# Patient Record
Sex: Female | Born: 1970 | Race: White | Hispanic: No | Marital: Married | Smoking: Never smoker
Health system: Southern US, Community
[De-identification: ages and names within clinical notes are randomized; demographics above are authoritative.]

## PROBLEM LIST (undated history)

## (undated) DIAGNOSIS — E063 Autoimmune thyroiditis: Secondary | ICD-10-CM

## (undated) DIAGNOSIS — K649 Unspecified hemorrhoids: Secondary | ICD-10-CM

## (undated) DIAGNOSIS — G43909 Migraine, unspecified, not intractable, without status migrainosus: Secondary | ICD-10-CM

## (undated) DIAGNOSIS — E039 Hypothyroidism, unspecified: Secondary | ICD-10-CM

## (undated) HISTORY — PX: ABDOMINAL HYSTERECTOMY: SHX81

## (undated) HISTORY — DX: Unspecified hemorrhoids: K64.9

## (undated) HISTORY — DX: Migraine, unspecified, not intractable, without status migrainosus: G43.909

## (undated) HISTORY — PX: ABLATION: SHX5711

## (undated) HISTORY — PX: APPENDECTOMY: SHX54

## (undated) HISTORY — PX: NOSE SURGERY: SHX723

---

## 2011-05-15 ENCOUNTER — Other Ambulatory Visit: Payer: Self-pay | Admitting: *Deleted

## 2011-05-15 DIAGNOSIS — R102 Pelvic and perineal pain: Secondary | ICD-10-CM

## 2011-05-16 ENCOUNTER — Ambulatory Visit
Admission: RE | Admit: 2011-05-16 | Discharge: 2011-05-16 | Disposition: A | Discharge status: Home / Self Care | Payer: BC Managed Care – PPO | Source: Ambulatory Visit | Attending: *Deleted | Admitting: *Deleted

## 2011-05-16 DIAGNOSIS — R102 Pelvic and perineal pain: Secondary | ICD-10-CM

## 2011-05-24 ENCOUNTER — Other Ambulatory Visit: Payer: Self-pay | Admitting: Obstetrics and Gynecology

## 2011-05-24 DIAGNOSIS — R102 Pelvic and perineal pain: Secondary | ICD-10-CM

## 2012-04-03 ENCOUNTER — Other Ambulatory Visit: Payer: Self-pay | Admitting: Obstetrics and Gynecology

## 2012-04-03 DIAGNOSIS — R928 Other abnormal and inconclusive findings on diagnostic imaging of breast: Secondary | ICD-10-CM

## 2012-04-08 ENCOUNTER — Ambulatory Visit
Admission: RE | Admit: 2012-04-08 | Discharge: 2012-04-08 | Disposition: A | Discharge status: Home / Self Care | Payer: 59 | Source: Ambulatory Visit | Attending: Obstetrics and Gynecology | Admitting: Obstetrics and Gynecology

## 2012-04-08 DIAGNOSIS — R928 Other abnormal and inconclusive findings on diagnostic imaging of breast: Secondary | ICD-10-CM

## 2012-10-27 ENCOUNTER — Other Ambulatory Visit: Payer: Self-pay | Admitting: Family Medicine

## 2012-10-27 DIAGNOSIS — R921 Mammographic calcification found on diagnostic imaging of breast: Secondary | ICD-10-CM

## 2013-01-22 ENCOUNTER — Ambulatory Visit
Admission: RE | Admit: 2013-01-22 | Discharge: 2013-01-22 | Disposition: A | Discharge status: Home / Self Care | Payer: 59 | Source: Ambulatory Visit | Attending: Family Medicine | Admitting: Family Medicine

## 2013-01-22 DIAGNOSIS — R921 Mammographic calcification found on diagnostic imaging of breast: Secondary | ICD-10-CM

## 2013-03-08 ENCOUNTER — Other Ambulatory Visit: Payer: Self-pay | Admitting: Obstetrics and Gynecology

## 2013-03-08 DIAGNOSIS — R921 Mammographic calcification found on diagnostic imaging of breast: Secondary | ICD-10-CM

## 2013-04-20 ENCOUNTER — Ambulatory Visit
Admission: RE | Admit: 2013-04-20 | Discharge: 2013-04-20 | Disposition: A | Discharge status: Home / Self Care | Payer: 59 | Source: Ambulatory Visit | Attending: Obstetrics and Gynecology | Admitting: Obstetrics and Gynecology

## 2013-04-20 DIAGNOSIS — R921 Mammographic calcification found on diagnostic imaging of breast: Secondary | ICD-10-CM

## 2014-05-27 IMAGING — MG MM DIGITAL DIAGNOSTIC BILAT
8 series · 8 of 8 positions shown · non-contrast
Comparison: 04/08/2012

CLINICAL DATA: The patient returns for 6-month follow-up of
calcifications in both breasts.

DIGITAL DIAGNOSTIC BILATERAL MAMMOGRAM WITH CAD

[R CC (1 of 2)]
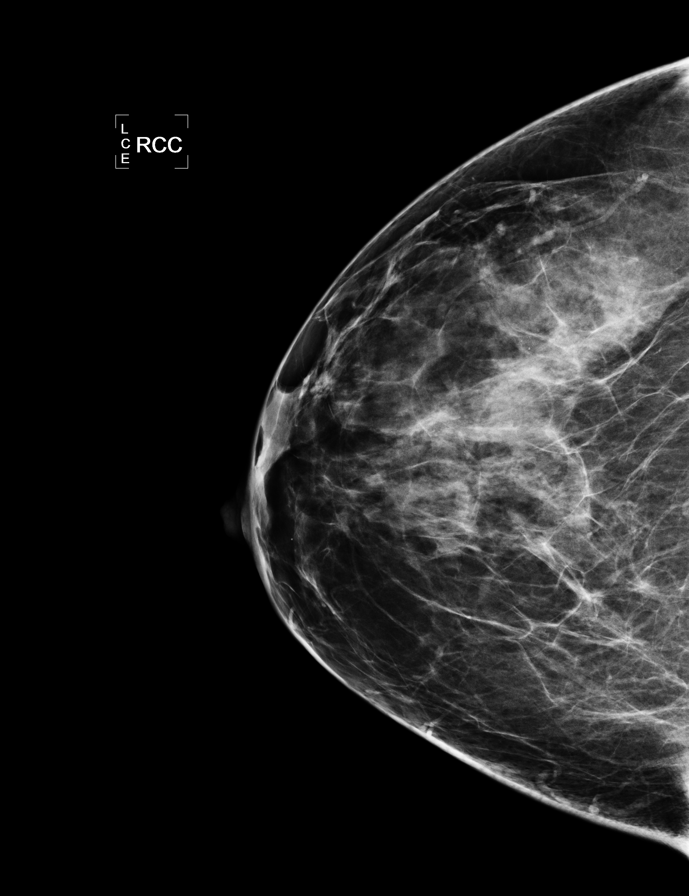

[L CC (1 of 2)]
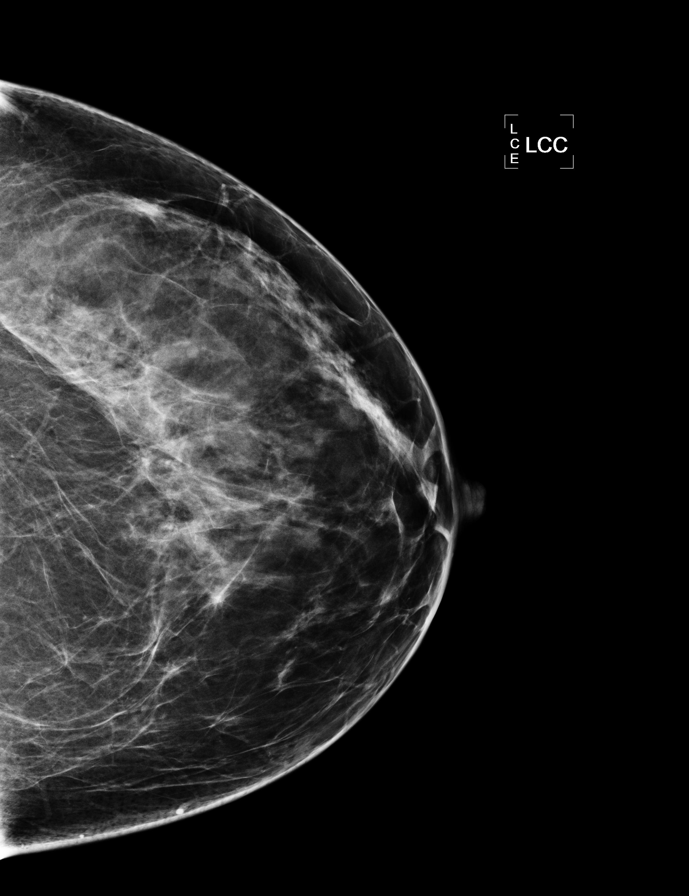

[L MLO]
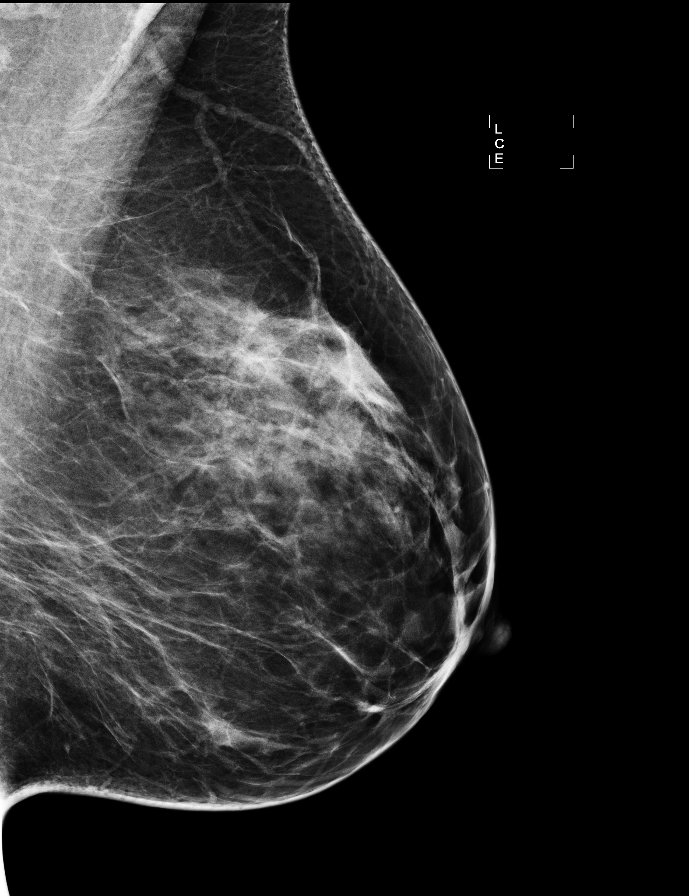

[R MLO]
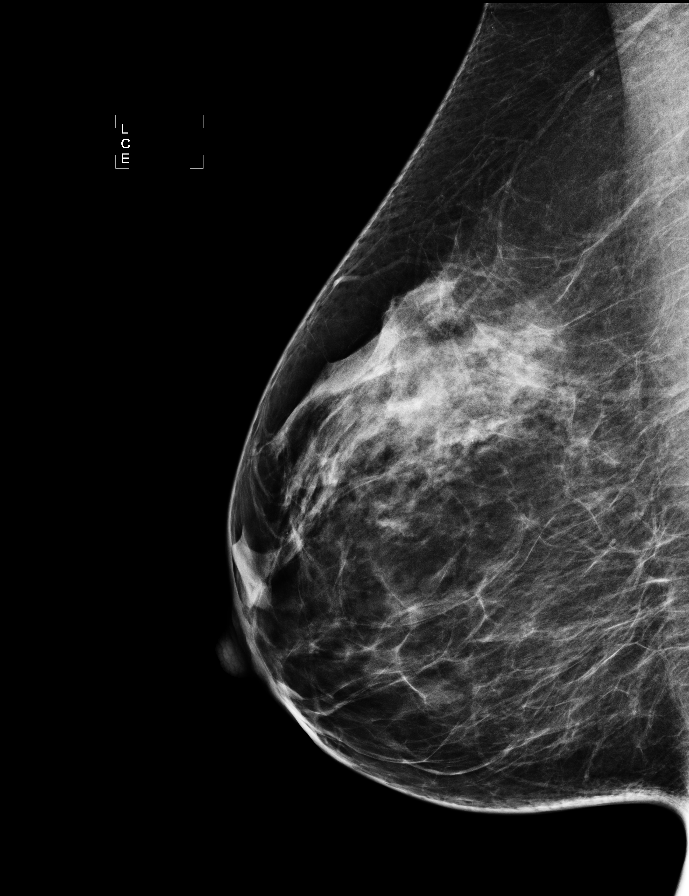

[L CC (2 of 2)]
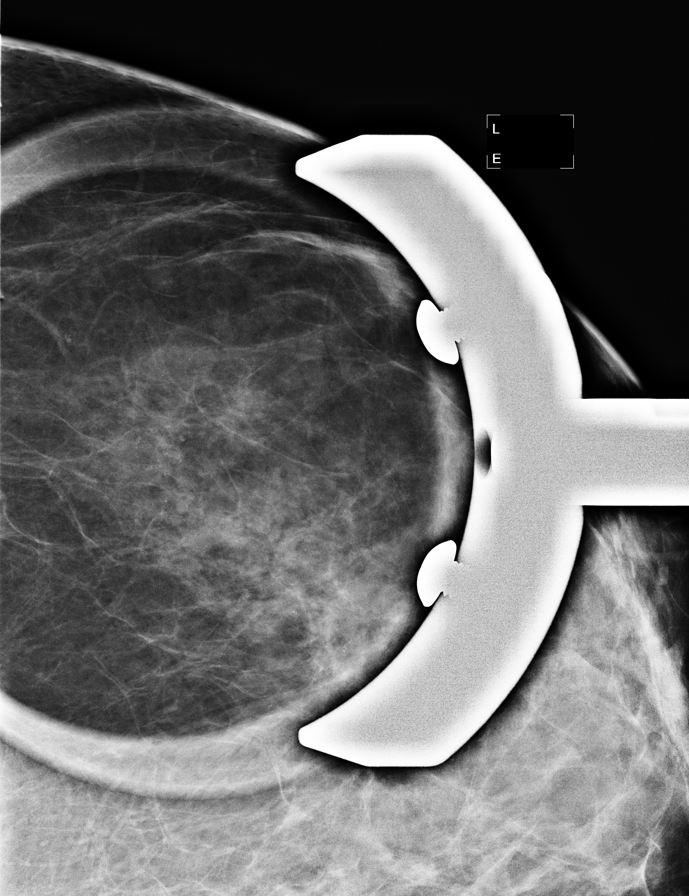

[L ML]
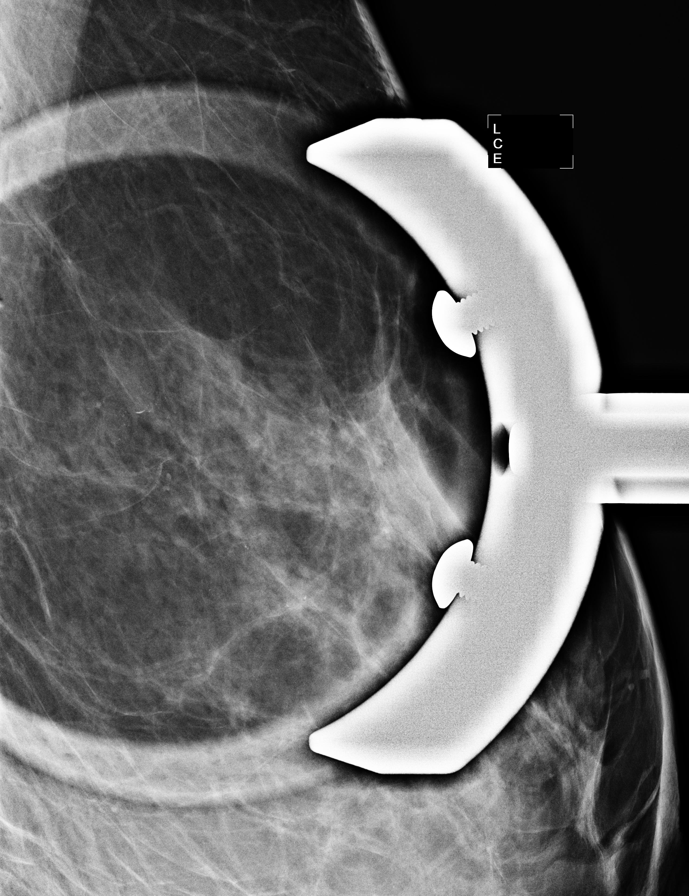

[R CC (2 of 2)]
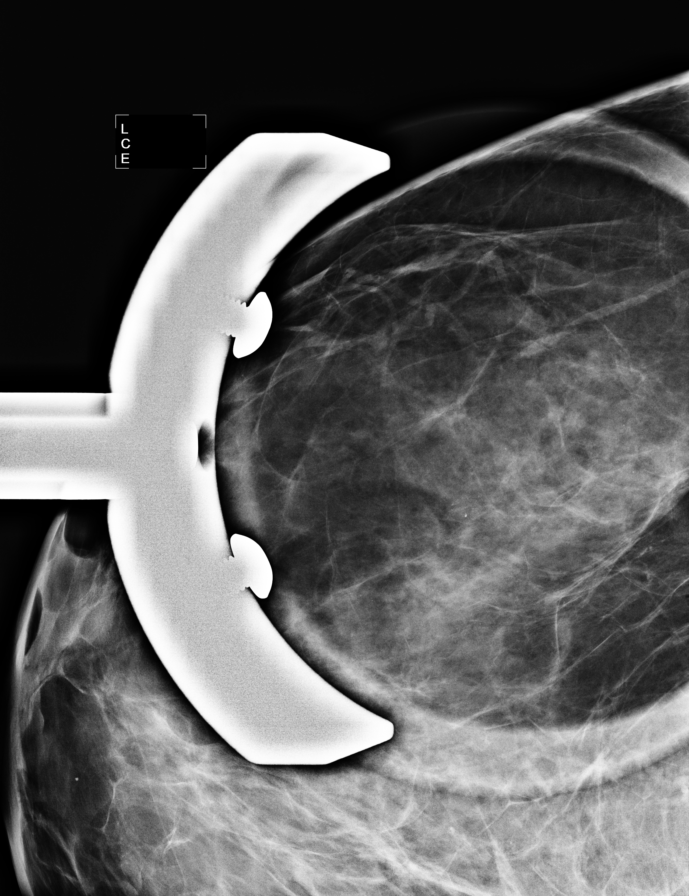

[R ML]
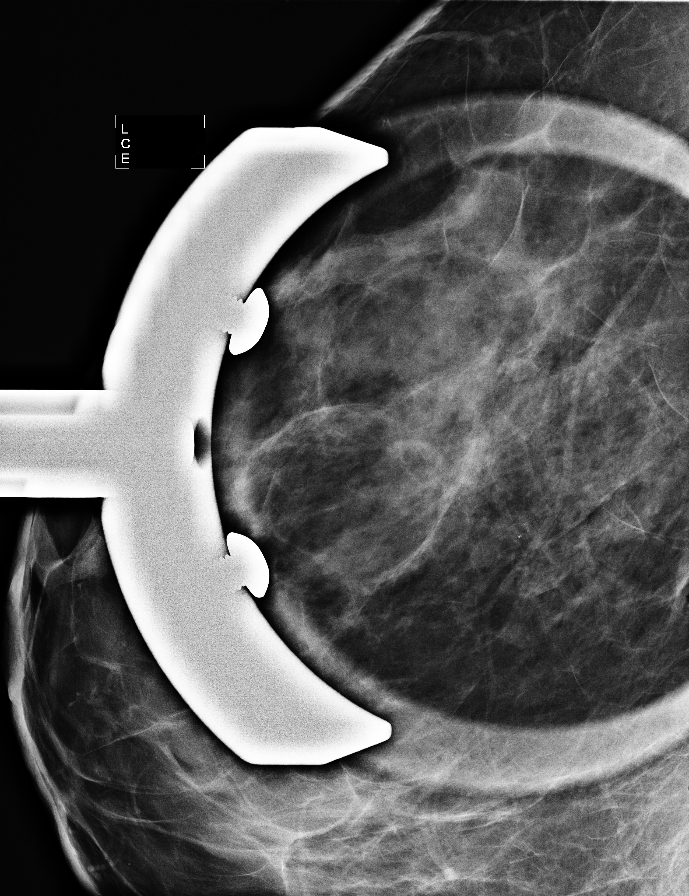

[8 of 8 positions shown; findings below may reference images not displayed]

FINDINGS: ACR Breast Density Category b: There is a scattered fibroglandular
pattern.

Magnified views are performed of calcifications in the lateral
aspects of each breast.  Calcifications appear to layer on the true
lateral view, consistent with benign, milk of calcium.  No
suspicious microcalcifications or distribution of calcifications
identified.

Mammographic images were processed with CAD.
IMPRESSION: No mammographic evidence for malignancy.

RECOMMENDATION:
Bilateral diagnostic mammogram with magnified views is suggested in
March 2013.

I have discussed the findings and recommendations with the patient.
Results were also provided in writing at the conclusion of the
visit.  If applicable, a reminder letter will be sent to the
patient regarding her next appointment.

BI-RADS CATEGORY 3:  Probably benign finding(s) - short interval
follow-up suggested.

## 2019-06-09 ENCOUNTER — Other Ambulatory Visit: Payer: Self-pay | Admitting: Obstetrics and Gynecology

## 2019-06-09 DIAGNOSIS — R928 Other abnormal and inconclusive findings on diagnostic imaging of breast: Secondary | ICD-10-CM

## 2019-06-10 ENCOUNTER — Ambulatory Visit
Admission: RE | Admit: 2019-06-10 | Discharge: 2019-06-10 | Disposition: A | Discharge status: Home / Self Care | Payer: 59 | Source: Ambulatory Visit | Attending: Obstetrics and Gynecology | Admitting: Obstetrics and Gynecology

## 2019-06-10 ENCOUNTER — Other Ambulatory Visit: Payer: Self-pay

## 2019-06-10 ENCOUNTER — Other Ambulatory Visit: Payer: Self-pay | Admitting: Obstetrics and Gynecology

## 2019-06-10 DIAGNOSIS — R928 Other abnormal and inconclusive findings on diagnostic imaging of breast: Secondary | ICD-10-CM

## 2019-06-10 DIAGNOSIS — N6320 Unspecified lump in the left breast, unspecified quadrant: Secondary | ICD-10-CM

## 2019-06-11 ENCOUNTER — Ambulatory Visit
Admission: RE | Admit: 2019-06-11 | Discharge: 2019-06-11 | Disposition: A | Discharge status: Home / Self Care | Payer: 59 | Source: Ambulatory Visit | Attending: Obstetrics and Gynecology | Admitting: Obstetrics and Gynecology

## 2019-06-11 DIAGNOSIS — N6320 Unspecified lump in the left breast, unspecified quadrant: Secondary | ICD-10-CM

## 2021-09-15 LAB — COLOGUARD: COLOGUARD: NEGATIVE

## 2023-01-01 ENCOUNTER — Other Ambulatory Visit: Payer: Self-pay | Admitting: Obstetrics and Gynecology

## 2023-01-01 DIAGNOSIS — R928 Other abnormal and inconclusive findings on diagnostic imaging of breast: Secondary | ICD-10-CM

## 2023-01-15 ENCOUNTER — Ambulatory Visit
Admission: RE | Admit: 2023-01-15 | Discharge: 2023-01-15 | Disposition: A | Discharge status: Home / Self Care | Payer: 59 | Source: Ambulatory Visit | Attending: Obstetrics and Gynecology | Admitting: Obstetrics and Gynecology

## 2023-01-15 ENCOUNTER — Other Ambulatory Visit: Payer: Self-pay | Admitting: Obstetrics and Gynecology

## 2023-01-15 DIAGNOSIS — R928 Other abnormal and inconclusive findings on diagnostic imaging of breast: Secondary | ICD-10-CM

## 2023-01-20 ENCOUNTER — Ambulatory Visit
Admission: RE | Admit: 2023-01-20 | Discharge: 2023-01-20 | Disposition: A | Discharge status: Home / Self Care | Payer: 59 | Source: Ambulatory Visit | Attending: Obstetrics and Gynecology | Admitting: Obstetrics and Gynecology

## 2023-01-20 DIAGNOSIS — R928 Other abnormal and inconclusive findings on diagnostic imaging of breast: Secondary | ICD-10-CM

## 2023-01-20 HISTORY — PX: BREAST BIOPSY: SHX20

## 2023-01-28 ENCOUNTER — Other Ambulatory Visit: Payer: Self-pay | Admitting: Surgery

## 2023-01-28 DIAGNOSIS — N6091 Unspecified benign mammary dysplasia of right breast: Secondary | ICD-10-CM

## 2023-01-29 ENCOUNTER — Other Ambulatory Visit: Payer: Self-pay | Admitting: Surgery

## 2023-01-29 DIAGNOSIS — N6091 Unspecified benign mammary dysplasia of right breast: Secondary | ICD-10-CM

## 2023-02-19 ENCOUNTER — Encounter (HOSPITAL_BASED_OUTPATIENT_CLINIC_OR_DEPARTMENT_OTHER): Payer: Self-pay | Admitting: Surgery

## 2023-02-26 ENCOUNTER — Ambulatory Visit
Admission: RE | Admit: 2023-02-26 | Discharge: 2023-02-26 | Disposition: A | Discharge status: Home / Self Care | Payer: 59 | Source: Ambulatory Visit | Attending: Surgery | Admitting: Surgery

## 2023-02-26 DIAGNOSIS — N6091 Unspecified benign mammary dysplasia of right breast: Secondary | ICD-10-CM

## 2023-02-26 HISTORY — PX: BREAST BIOPSY: SHX20

## 2023-02-26 NOTE — H&P (Signed)
REFERRING PHYSICIAN: Jeani Hawking, MD PROVIDER: Wayne Both, MD MRN: [id number] DOB: 1971/04/03  Subjective   Chief Complaint: New Consultation and Breast Problem  History of Present Illness: Jessica Dickson is a 52 y.o. female who is seen as an office consultation for evaluation of New Consultation and Breast Problem  This is a pleasant 52 year old female who was recently found on screening mammography of calcifications in the upper outer quadrant of the right breast. This measured approximately 4.5 cm. She had 2 separate biopsies of the area with 1 showing fibrocystic changes and the other showing atypical ductal hyperplasia. She has had a previous benign biopsy on the left breast years ago. She denies nipple discharge. She has a family history of breast cancer in 2 paternal aunts and her paternal grandmother. She is otherwise healthy without complaints  Review of Systems: A complete review of systems was obtained from the patient. I have reviewed this information and discussed as appropriate with the patient. See HPI as well for other ROS.  ROS   Medical History: Past Medical History:  Diagnosis Date  Thyroid disease   There is no problem list on file for this patient.  Past Surgical History:  Procedure Laterality Date  ABLATION ARRYTHMIA FOCUS  APPENDECTOMY  HYSTERECTOMY  nose surgery    Allergies  Allergen Reactions  Codeine Rash   Current Outpatient Medications on File Prior to Visit  Medication Sig Dispense Refill  cetirizine (ZYRTEC) 10 MG tablet Take 10 mg by mouth once daily  ergocalciferol, vitamin D2, 1,250 mcg (50,000 unit) capsule TAKE 1 CAPSULE BY MOUTH TWICE WEEKLY  estradiol (DOTTI) patch 0.075 mg/24hr  NP THYROID 15 mg tablet Take 15 mg by mouth once daily  omega-3 acid ethyl esters (LOVAZA) 1 gram capsule Take 2 g by mouth 2 (two) times daily  progesterone (PROMETRIUM) 200 MG capsule   No current facility-administered medications on  file prior to visit.   Family History  Problem Relation Age of Onset  High blood pressure (Hypertension) Mother  Skin cancer Father  Coronary Artery Disease (Blocked arteries around heart) Father    Social History   Tobacco Use  Smoking Status Never  Smokeless Tobacco Never    Social History   Socioeconomic History  Marital status: Married  Tobacco Use  Smoking status: Never  Smokeless tobacco: Never  Substance and Sexual Activity  Alcohol use: Yes  Comment: social  Drug use: Never   Objective:   Vitals:  01/28/23 1549  BP: (!) 147/82  Pulse: 71  Temp: 36.7 C (98 F)  SpO2: 98%  Weight: 79.8 kg (176 lb)  Height: 167.6 cm (5\' 6" )   Body mass index is 28.41 kg/m.  Physical Exam   She appears well on exam  There are no palpable breast masses. There is mild ecchymosis from her biopsy of the right breast. The nipple areolar complex is normal.  There is no axillary adenopathy  Labs, Imaging and Diagnostic Testing: I have reviewed her mammograms, ultrasound, and pathology results  Assessment and Plan:   Diagnoses and all orders for this visit:  Atypical ductal hyperplasia of right breast   I discussed the diagnosis with the patient and gave her a copy of the pathology results. It is recommended that we remove the area of atypical ductal hyperplasia for complete histologic evaluation to rule out and early cancer. I discussed the reasons for this with her. I explained proceeding with a radioactive seed guided right breast lumpectomy. I would probably do a  significant lumpectomy to remove more of the calcifications surrounding this area. I explained the surgical procedure in detail. I discussed the risks which includes but is not limited to bleeding, infection, injury to surrounding structures, the need for further procedures if malignancy is found, cardiopulmonary issues with anesthesia, postoperative recovery, etc. She understands and wishes to proceed with  surgery which will be scheduled.

## 2023-02-26 NOTE — Anesthesia Preprocedure Evaluation (Addendum)
Anesthesia Evaluation  Patient identified by MRN, date of birth, ID band Patient awake    Reviewed: Allergy & Precautions, NPO status , Patient's Chart, lab work & pertinent test results  Airway Mallampati: I  TM Distance: >3 FB Neck ROM: Full    Dental no notable dental hx. (+) Implants   Pulmonary neg pulmonary ROS   Pulmonary exam normal breath sounds clear to auscultation       Cardiovascular negative cardio ROS Normal cardiovascular exam Rhythm:Regular Rate:Normal     Neuro/Psych negative neurological ROS  negative psych ROS   GI/Hepatic negative GI ROS, Neg liver ROS,,,  Endo/Other  Hypothyroidism    Renal/GU negative Renal ROS  negative genitourinary   Musculoskeletal negative musculoskeletal ROS (+)    Abdominal   Peds  Hematology   Anesthesia Other Findings   Reproductive/Obstetrics                              Anesthesia Physical Anesthesia Plan  ASA: 2  Anesthesia Plan: General   Post-op Pain Management: Precedex, Tylenol PO (pre-op)* and Toradol IV (intra-op)*   Induction: Intravenous  PONV Risk Score and Plan: 3 and Propofol infusion, TIVA, Treatment may vary due to age or medical condition and Ondansetron  Airway Management Planned: LMA  Additional Equipment: None  Intra-op Plan:   Post-operative Plan: Extubation in OR  Informed Consent: I have reviewed the patients History and Physical, chart, labs and discussed the procedure including the risks, benefits and alternatives for the proposed anesthesia with the patient or authorized representative who has indicated his/her understanding and acceptance.     Dental advisory given  Plan Discussed with:   Anesthesia Plan Comments: (LMA TIVA  R breast CA)         Anesthesia Quick Evaluation

## 2023-02-26 NOTE — Progress Notes (Signed)

## 2023-02-27 ENCOUNTER — Ambulatory Visit (HOSPITAL_BASED_OUTPATIENT_CLINIC_OR_DEPARTMENT_OTHER): Payer: Self-pay | Admitting: Anesthesiology

## 2023-02-27 ENCOUNTER — Encounter (HOSPITAL_BASED_OUTPATIENT_CLINIC_OR_DEPARTMENT_OTHER): Payer: Self-pay | Admitting: Surgery

## 2023-02-27 ENCOUNTER — Ambulatory Visit
Admission: RE | Admit: 2023-02-27 | Discharge: 2023-02-27 | Disposition: A | Discharge status: Home / Self Care | Payer: 59 | Source: Ambulatory Visit | Attending: Surgery | Admitting: Surgery

## 2023-02-27 ENCOUNTER — Ambulatory Visit (HOSPITAL_BASED_OUTPATIENT_CLINIC_OR_DEPARTMENT_OTHER)
Admission: RE | Admit: 2023-02-27 | Discharge: 2023-02-27 | Disposition: A | Discharge status: Home / Self Care | Payer: 59 | Attending: Surgery | Admitting: Surgery

## 2023-02-27 ENCOUNTER — Other Ambulatory Visit: Payer: Self-pay

## 2023-02-27 ENCOUNTER — Encounter (HOSPITAL_BASED_OUTPATIENT_CLINIC_OR_DEPARTMENT_OTHER)
Admission: RE | Disposition: A | Discharge status: Home / Self Care | Payer: Self-pay | Source: Home / Self Care | Attending: Surgery

## 2023-02-27 ENCOUNTER — Ambulatory Visit (HOSPITAL_BASED_OUTPATIENT_CLINIC_OR_DEPARTMENT_OTHER): Payer: 59 | Admitting: Anesthesiology

## 2023-02-27 DIAGNOSIS — Z79899 Other long term (current) drug therapy: Secondary | ICD-10-CM | POA: Diagnosis not present

## 2023-02-27 DIAGNOSIS — E039 Hypothyroidism, unspecified: Secondary | ICD-10-CM | POA: Insufficient documentation

## 2023-02-27 DIAGNOSIS — Z803 Family history of malignant neoplasm of breast: Secondary | ICD-10-CM | POA: Diagnosis not present

## 2023-02-27 DIAGNOSIS — N6091 Unspecified benign mammary dysplasia of right breast: Secondary | ICD-10-CM | POA: Diagnosis present

## 2023-02-27 DIAGNOSIS — Z79890 Hormone replacement therapy (postmenopausal): Secondary | ICD-10-CM | POA: Insufficient documentation

## 2023-02-27 DIAGNOSIS — E079 Disorder of thyroid, unspecified: Secondary | ICD-10-CM | POA: Insufficient documentation

## 2023-02-27 DIAGNOSIS — Z419 Encounter for procedure for purposes other than remedying health state, unspecified: Secondary | ICD-10-CM

## 2023-02-27 HISTORY — PX: BREAST LUMPECTOMY WITH RADIOACTIVE SEED LOCALIZATION: SHX6424

## 2023-02-27 HISTORY — DX: Hypothyroidism, unspecified: E03.9

## 2023-02-27 HISTORY — DX: Autoimmune thyroiditis: E06.3

## 2023-02-27 SURGERY — BREAST LUMPECTOMY WITH RADIOACTIVE SEED LOCALIZATION
Anesthesia: General | Site: Breast | Laterality: Right

## 2023-02-27 MED ORDER — ONDANSETRON HCL 4 MG/2ML IJ SOLN
INTRAMUSCULAR | Status: DC | PRN
  Administered 2023-02-27: 4 mg via INTRAVENOUS

## 2023-02-27 MED ORDER — 0.9 % SODIUM CHLORIDE (POUR BTL) OPTIME
TOPICAL | Status: DC | PRN
  Administered 2023-02-27: 1000 mL

## 2023-02-27 MED ORDER — ENSURE PRE-SURGERY PO LIQD: 296.0000 mL | Freq: Once | ORAL | Status: DC

## 2023-02-27 MED ORDER — KETOROLAC TROMETHAMINE 30 MG/ML IJ SOLN
INTRAMUSCULAR | Status: DC | PRN
  Administered 2023-02-27: 30 mg via INTRAVENOUS

## 2023-02-27 MED ORDER — LACTATED RINGERS IV SOLN: INTRAVENOUS | Status: DC

## 2023-02-27 MED ORDER — PROPOFOL 500 MG/50ML IV EMUL
INTRAVENOUS | Status: DC | PRN
  Administered 2023-02-27: 200 ug/kg/min via INTRAVENOUS

## 2023-02-27 MED ORDER — KETOROLAC TROMETHAMINE 30 MG/ML IJ SOLN: 30.0000 mg | Freq: Once | INTRAMUSCULAR | Status: DC | PRN

## 2023-02-27 MED ORDER — ONDANSETRON HCL 4 MG/2ML IJ SOLN: 4.0000 mg | Freq: Once | INTRAMUSCULAR | Status: DC | PRN

## 2023-02-27 MED ORDER — FENTANYL CITRATE (PF) 100 MCG/2ML IJ SOLN
INTRAMUSCULAR | Status: DC | PRN
  Administered 2023-02-27: 50 ug via INTRAVENOUS

## 2023-02-27 MED ORDER — FENTANYL CITRATE (PF) 100 MCG/2ML IJ SOLN
INTRAMUSCULAR | Status: AC
  Filled 2023-02-27: qty 2

## 2023-02-27 MED ORDER — ACETAMINOPHEN 500 MG PO TABS
ORAL_TABLET | ORAL | Status: AC
  Filled 2023-02-27: qty 2

## 2023-02-27 MED ORDER — DEXAMETHASONE SODIUM PHOSPHATE 10 MG/ML IJ SOLN
INTRAMUSCULAR | Status: AC
  Filled 2023-02-27: qty 1

## 2023-02-27 MED ORDER — MIDAZOLAM HCL 2 MG/2ML IJ SOLN
INTRAMUSCULAR | Status: AC
  Filled 2023-02-27: qty 2

## 2023-02-27 MED ORDER — DEXMEDETOMIDINE HCL IN NACL 80 MCG/20ML IV SOLN
INTRAVENOUS | Status: DC | PRN
  Administered 2023-02-27: 12 ug via INTRAVENOUS

## 2023-02-27 MED ORDER — PROPOFOL 500 MG/50ML IV EMUL
INTRAVENOUS | Status: AC
  Filled 2023-02-27: qty 50

## 2023-02-27 MED ORDER — HYDROMORPHONE HCL 1 MG/ML IJ SOLN: 0.2500 mg | INTRAMUSCULAR | Status: DC | PRN

## 2023-02-27 MED ORDER — OXYCODONE HCL 5 MG/5ML PO SOLN: 5.0000 mg | Freq: Once | ORAL | Status: DC | PRN

## 2023-02-27 MED ORDER — MIDAZOLAM HCL 5 MG/5ML IJ SOLN
INTRAMUSCULAR | Status: DC | PRN
  Administered 2023-02-27: 2 mg via INTRAVENOUS

## 2023-02-27 MED ORDER — TRAMADOL HCL 50 MG PO TABS: 50.0000 mg | ORAL_TABLET | Freq: Four times a day (QID) | ORAL | 0 refills | Status: DC | PRN

## 2023-02-27 MED ORDER — CHLORHEXIDINE GLUCONATE CLOTH 2 % EX PADS: 6.0000 | MEDICATED_PAD | Freq: Once | CUTANEOUS | Status: DC

## 2023-02-27 MED ORDER — AMISULPRIDE (ANTIEMETIC) 5 MG/2ML IV SOLN: 10.0000 mg | Freq: Once | INTRAVENOUS | Status: DC | PRN

## 2023-02-27 MED ORDER — ONDANSETRON HCL 4 MG/2ML IJ SOLN
INTRAMUSCULAR | Status: AC
  Filled 2023-02-27: qty 2

## 2023-02-27 MED ORDER — OXYCODONE HCL 5 MG PO TABS: 5.0000 mg | ORAL_TABLET | Freq: Once | ORAL | Status: DC | PRN

## 2023-02-27 MED ORDER — BUPIVACAINE-EPINEPHRINE 0.5% -1:200000 IJ SOLN
INTRAMUSCULAR | Status: DC | PRN
  Administered 2023-02-27: 12 mL

## 2023-02-27 MED ORDER — CEFAZOLIN SODIUM-DEXTROSE 2-4 GM/100ML-% IV SOLN
INTRAVENOUS | Status: AC
  Filled 2023-02-27: qty 100

## 2023-02-27 MED ORDER — PROPOFOL 10 MG/ML IV BOLUS
INTRAVENOUS | Status: DC | PRN
Start: 2023-02-27 — End: 2023-02-27
  Administered 2023-02-27: 150 mg via INTRAVENOUS
  Administered 2023-02-27: 50 mg via INTRAVENOUS

## 2023-02-27 MED ORDER — LIDOCAINE 2% (20 MG/ML) 5 ML SYRINGE
INTRAMUSCULAR | Status: AC
  Filled 2023-02-27: qty 5

## 2023-02-27 MED ORDER — BUPIVACAINE-EPINEPHRINE (PF) 0.5% -1:200000 IJ SOLN
INTRAMUSCULAR | Status: AC
  Filled 2023-02-27: qty 30

## 2023-02-27 MED ORDER — DEXAMETHASONE SODIUM PHOSPHATE 4 MG/ML IJ SOLN
INTRAMUSCULAR | Status: DC | PRN
  Administered 2023-02-27: 5 mg via INTRAVENOUS

## 2023-02-27 MED ORDER — ACETAMINOPHEN 500 MG PO TABS
1000.0000 mg | ORAL_TABLET | ORAL | Status: AC
  Administered 2023-02-27: 1000 mg via ORAL

## 2023-02-27 MED ORDER — CEFAZOLIN SODIUM-DEXTROSE 2-4 GM/100ML-% IV SOLN
2.0000 g | INTRAVENOUS | Status: AC
  Administered 2023-02-27: 2 g via INTRAVENOUS

## 2023-02-27 SURGICAL SUPPLY — 48 items
ADH SKN CLS APL DERMABOND .7 (GAUZE/BANDAGES/DRESSINGS) ×1
APL PRP STRL LF DISP 70% ISPRP (MISCELLANEOUS) ×1
APPLIER CLIP 9.375 MED OPEN (MISCELLANEOUS)
APR CLP MED 9.3 20 MLT OPN (MISCELLANEOUS)
BINDER BREAST 3XL (GAUZE/BANDAGES/DRESSINGS) IMPLANT
BINDER BREAST LRG (GAUZE/BANDAGES/DRESSINGS) IMPLANT
BINDER BREAST MEDIUM (GAUZE/BANDAGES/DRESSINGS) IMPLANT
BINDER BREAST XLRG (GAUZE/BANDAGES/DRESSINGS) IMPLANT
BINDER BREAST XXLRG (GAUZE/BANDAGES/DRESSINGS) IMPLANT
BLADE SURG 15 STRL LF DISP TIS (BLADE) ×2 IMPLANT
BLADE SURG 15 STRL SS (BLADE) ×1
CANISTER SUC SOCK COL 7IN (MISCELLANEOUS) IMPLANT
CANISTER SUCT 1200ML W/VALVE (MISCELLANEOUS) IMPLANT
CHLORAPREP W/TINT 26 (MISCELLANEOUS) ×1 IMPLANT
CLIP APPLIE 9.375 MED OPEN (MISCELLANEOUS) IMPLANT
COVER BACK TABLE 60X90IN (DRAPES) ×1 IMPLANT
COVER MAYO STAND STRL (DRAPES) ×2 IMPLANT
COVER PROBE CYLINDRICAL 5X96 (MISCELLANEOUS) ×2 IMPLANT
DERMABOND ADVANCED .7 DNX12 (GAUZE/BANDAGES/DRESSINGS) ×2 IMPLANT
DRAPE LAPAROSCOPIC ABDOMINAL (DRAPES) ×2 IMPLANT
DRAPE UTILITY XL STRL (DRAPES) ×2 IMPLANT
ELECT REM PT RETURN 9FT ADLT (ELECTROSURGICAL) ×1
ELECTRODE REM PT RTRN 9FT ADLT (ELECTROSURGICAL) ×1 IMPLANT
GAUZE SPONGE 4X4 12PLY STRL (GAUZE/BANDAGES/DRESSINGS) IMPLANT
GAUZE SPONGE 4X4 12PLY STRL LF (GAUZE/BANDAGES/DRESSINGS) IMPLANT
GLOVE SURG SIGNA 7.5 PF LTX (GLOVE) ×2 IMPLANT
GOWN STRL REUS W/ TWL LRG LVL3 (GOWN DISPOSABLE) ×2 IMPLANT
GOWN STRL REUS W/ TWL XL LVL3 (GOWN DISPOSABLE) ×1 IMPLANT
GOWN STRL REUS W/TWL LRG LVL3 (GOWN DISPOSABLE) ×1
GOWN STRL REUS W/TWL XL LVL3 (GOWN DISPOSABLE) ×1
KIT MARKER MARGIN INK (KITS) ×2 IMPLANT
NDL HYPO 25X1 1.5 SAFETY (NEEDLE) ×2 IMPLANT
NEEDLE HYPO 25X1 1.5 SAFETY (NEEDLE) ×1 IMPLANT
NS IRRIG 1000ML POUR BTL (IV SOLUTION) IMPLANT
PACK BASIN DAY SURGERY FS (CUSTOM PROCEDURE TRAY) ×1 IMPLANT
PENCIL SMOKE EVACUATOR (MISCELLANEOUS) ×1 IMPLANT
SLEEVE SCD COMPRESS KNEE MED (STOCKING) ×2 IMPLANT
SPIKE FLUID TRANSFER (MISCELLANEOUS) IMPLANT
SPONGE T-LAP 4X18 ~~LOC~~+RFID (SPONGE) ×1 IMPLANT
SUT MNCRL AB 4-0 PS2 18 (SUTURE) ×1 IMPLANT
SUT SILK 2 0 SH (SUTURE) IMPLANT
SUT VIC AB 3-0 SH 27 (SUTURE) ×2
SUT VIC AB 3-0 SH 27X BRD (SUTURE) ×2 IMPLANT
SYR CONTROL 10ML LL (SYRINGE) ×2 IMPLANT
TOWEL GREEN STERILE FF (TOWEL DISPOSABLE) ×2 IMPLANT
TRAY FAXITRON CT DISP (TRAY / TRAY PROCEDURE) ×1 IMPLANT
TUBE CONNECTING 20X1/4 (TUBING) IMPLANT
YANKAUER SUCT BULB TIP NO VENT (SUCTIONS) IMPLANT

## 2023-02-27 NOTE — Anesthesia Procedure Notes (Signed)
Procedure Name: LMA Insertion Date/Time: 02/27/2023 7:37 AM  Performed by: Burna Cash, CRNAPre-anesthesia Checklist: Patient identified, Emergency Drugs available, Suction available and Patient being monitored Patient Re-evaluated:Patient Re-evaluated prior to induction Oxygen Delivery Method: Circle system utilized Preoxygenation: Pre-oxygenation with 100% oxygen Induction Type: IV induction Ventilation: Mask ventilation without difficulty LMA: LMA inserted LMA Size: 4.0 Number of attempts: 1 Airway Equipment and Method: Bite block Placement Confirmation: positive ETCO2 Tube secured with: Tape Dental Injury: Injury to lip  Comments: Small nick to upper lip

## 2023-02-27 NOTE — Anesthesia Postprocedure Evaluation (Signed)
Anesthesia Post Note  Patient: Jessica Dickson  Procedure(s) Performed: RIGHT BREAST LUMPECTOMY WITH RADIOACTIVE SEED LOCALIZATION (Right: Breast)     Patient location during evaluation: PACU Anesthesia Type: General Level of consciousness: awake and alert Pain management: pain level controlled Vital Signs Assessment: post-procedure vital signs reviewed and stable Respiratory status: spontaneous breathing, nonlabored ventilation, respiratory function stable and patient connected to nasal cannula oxygen Cardiovascular status: blood pressure returned to baseline and stable Postop Assessment: no apparent nausea or vomiting Anesthetic complications: no   No notable events documented.  Last Vitals:  Vitals:   02/27/23 0845 02/27/23 0854  BP: 123/78 130/76  Pulse: 62 (!) 54  Resp: 14 16  Temp:  (!) 36.1 C  SpO2: 98% 99%    Last Pain:  Vitals:   02/27/23 0854  TempSrc:   PainSc: 0-No pain                 Trevor Iha

## 2023-02-27 NOTE — Op Note (Signed)
RIGHT BREAST LUMPECTOMY WITH RADIOACTIVE SEED LOCALIZATION  Procedure Note  Jessica Dickson 02/27/2023   Pre-op Diagnosis: RIGHT BREAST ATYPICAL DUCTAL HYPERPLASIA     Post-op Diagnosis: same  Procedure(s): RIGHT BREAST LUMPECTOMY WITH RADIOACTIVE SEED LOCALIZATION  Surgeon(s): Abigail Miyamoto, MD  Anesthesia: General  Staff:  Circulator: Sofie Rower, RN Relief Circulator: McDonough-Hughes, Maceo Pro, RN Scrub Person: Donald Pore, CST; [place], Normanna K  Estimated Blood Loss: Minimal               Specimens: sent to path  Indications: This is a 52 year old female was found to have calcifications in her upper outer quadrant of the right breast.  She had 2 separate biopsies.  1 showed fibrocystic changes and the other showed atypical ductal hyperplasia.  The decision was made to proceed to the operating room for a right breast lumpectomy to remove the area of ADH  Procedure: The patient was brought to the operating room and identified the correct patient.  She was placed upon the operating table and general anesthesia was induced.  Her right breast was prepped and draped in usual sterile fashion.  Using the neoprobe I located the radioactive seed in the upper outer quadrant laterally.  I anesthetized the lateral edge of the areola with Marcaine and then made a circumareolar incision with a scalpel.  I then dissected down to the breast tissue and then laterally with the cautery.  I continued to dissect laterally until I was lateral to the radioactive seed with the aid of the neoprobe.  I then dissected down to the deep breast tissue.  She had very dense breast tissue.  I then stayed widely around the radioactive seed and then dissecting posterior to it with the cautery.  I then completed the lumpectomy removing the breast tissue around the radioactive seed signal.  Once the lumpectomy specimen was removed, I marked the margins with paint.  An x-ray was performed confirming the  radioactive seed and previous biopsy clip were in the specimen.  The specimen was then sent to pathology for evaluation.  I achieved hemostasis with the cautery as well as several 3-0 Vicryl sutures in the deep breast tissue.  I anesthetized the incision further with Marcaine.  Again, hemostasis appeared to be achieved.  I then closed the subcutaneous tissue with interrupted 3-0 Vicryl sutures and closed the skin with a running 4-0 Monocryl suture.  Dermabond was then applied.  The patient was then placed in a breast binder.  She tolerated the procedure well.  All the counts were correct at the end of the procedure.  She was then extubated in the operating room and taken in stable condition to the recovery room.          Abigail Miyamoto   Date: 02/27/2023  Time: 8:15 AM

## 2023-02-27 NOTE — Interval H&P Note (Signed)
History and Physical Interval Note:no change in H and P  02/27/2023 7:01 AM  Jessica Dickson  has presented today for surgery, with the diagnosis of RIGHT BREAST ATYPICAL DUCTAL HYPERPLASIA.  The various methods of treatment have been discussed with the patient and family. After consideration of risks, benefits and other options for treatment, the patient has consented to  Procedure(s) with comments: RIGHT BREAST LUMPECTOMY WITH RADIOACTIVE SEED LOCALIZATION (Right) - LMA as a surgical intervention.  The patient's history has been reviewed, patient examined, no change in status, stable for surgery.  I have reviewed the patient's chart and labs.  Questions were answered to the patient's satisfaction.     Jessica Dickson

## 2023-02-27 NOTE — Transfer of Care (Signed)
Immediate Anesthesia Transfer of Care Note  Patient: Jessica Dickson  Procedure(s) Performed: RIGHT BREAST LUMPECTOMY WITH RADIOACTIVE SEED LOCALIZATION (Right: Breast)  Patient Location: PACU  Anesthesia Type:General  Level of Consciousness: sedated  Airway & Oxygen Therapy: Patient Spontanous Breathing and Patient connected to face mask oxygen  Post-op Assessment: Report given to RN and Post -op Vital signs reviewed and stable  Post vital signs: Reviewed and stable  Last Vitals:  Vitals Value Taken Time  BP 132/77 02/27/23 0822  Temp 36.2 C 02/27/23 0822  Pulse 84 02/27/23 0827  Resp 15 02/27/23 0827  SpO2 98 % 02/27/23 0827  Vitals shown include unfiled device data.  Last Pain:  Vitals:   02/27/23 0642  TempSrc: Oral  PainSc: 0-No pain         Complications: No notable events documented.

## 2023-02-27 NOTE — Discharge Instructions (Addendum)
[facility] Office Phone Number [phone]  BREAST BIOPSY/ PARTIAL MASTECTOMY: POST OP INSTRUCTIONS  Always review your discharge instruction sheet given to you by the facility where your surgery was performed.  IF YOU HAVE DISABILITY OR FAMILY LEAVE FORMS, YOU MUST BRING THEM TO THE OFFICE FOR PROCESSING.  DO NOT GIVE THEM TO YOUR DOCTOR.  A prescription for pain medication may be given to you upon discharge.  Take your pain medication as prescribed, if needed.  If narcotic pain medicine is not needed, then you may take acetaminophen (Tylenol) or ibuprofen (Advil) as needed. No Tylenol until 12:45pm today, if needed. No Ibuprofen until after 2:00pm today, if needed. Take your usually prescribed medications unless otherwise directed If you need a refill on your pain medication, please contact your pharmacy.  They will contact our office to request authorization.  Prescriptions will not be filled after 5pm or on week-ends. You should eat very light the first 24 hours after surgery, such as soup, crackers, pudding, etc.  Resume your normal diet the day after surgery. Most patients will experience some swelling and bruising in the breast.  Ice packs and a good support bra will help.  Swelling and bruising can take several days to resolve.  It is common to experience some constipation if taking pain medication after surgery.  Increasing fluid intake and taking a stool softener will usually help or prevent this problem from occurring.  A mild laxative (Milk of Magnesia or Miralax) should be taken according to package directions if there are no bowel movements after 48 hours. Unless discharge instructions indicate otherwise, you may remove your bandages 24-48 hours after surgery, and you may shower at that time.  You may have steri-strips (small skin tapes) in place directly over the incision.  These strips should be left on the skin for 7-10 days.  If your surgeon used skin glue on the  incision, you may shower in 24 hours.  The glue will flake off over the next 2-3 weeks.  Any sutures or staples will be removed at the office during your follow-up visit. ACTIVITIES:  You may resume regular daily activities (gradually increasing) beginning the next day.  Wearing a good support bra or sports bra minimizes pain and swelling.  You may have sexual intercourse when it is comfortable. You may drive when you no longer are taking prescription pain medication, you can comfortably wear a seatbelt, and you can safely maneuver your car and apply brakes. RETURN TO WORK:  ______________________________________________________________________________________ Bonita Quin should see your doctor in the office for a follow-up appointment approximately two weeks after your surgery.  Your doctor's nurse will typically make your follow-up appointment when she calls you with your pathology report.  Expect your pathology report 2-3 business days after your surgery.  You may call to check if you do not hear from [place] after three days. OTHER INSTRUCTIONS: YOU MAY REMOVE THE BINDER AND SHOWER STARTING TOMORROW ICE PACK, TYLENOL, AND IBUPROFEN ALSO FOR PAIN NO VIGOROUS ACTIVITY FOR ONE WEEK _______________________________________________________________________________________________ _____________________________________________________________________________________________________________________________________ _____________________________________________________________________________________________________________________________________ _____________________________________________________________________________________________________________________________________  WHEN TO CALL YOUR DOCTOR: Fever over 101.0 Nausea and/or vomiting. Extreme swelling or bruising. Continued bleeding from incision. Increased pain, redness, or drainage from the incision.  The clinic staff is available to answer your questions  during regular business hours.  Please don't hesitate to call and ask to speak to one of the nurses for clinical concerns.  If you have a medical emergency, go to the nearest emergency room or call 911.  A surgeon from [facility] is always on call at the hospital.  For further questions, please visit [email or web address]   Postoperative Anesthesia Instructions-Pediatric  Activity: Your child should rest for the remainder of the day. A responsible individual must stay with your child for 24 hours.  Meals: Your child should start with liquids and light foods such as gelatin or soup unless otherwise instructed by the physician. Progress to regular foods as tolerated. Avoid spicy, greasy, and heavy foods. If nausea and/or vomiting occur, drink only clear liquids such as apple juice or Pedialyte until the nausea and/or vomiting subsides. Call your physician if vomiting continues.  Special Instructions/Symptoms: Your child may be drowsy for the rest of the day, although some children experience some hyperactivity a few hours after the surgery. Your child may also experience some irritability or crying episodes due to the operative procedure and/or anesthesia. Your child's throat may feel dry or sore from the anesthesia or the breathing tube placed in the throat during surgery. Use throat lozenges, sprays, or ice chips if needed.

## 2023-02-28 ENCOUNTER — Encounter (HOSPITAL_BASED_OUTPATIENT_CLINIC_OR_DEPARTMENT_OTHER): Payer: Self-pay | Admitting: Surgery

## 2024-01-29 ENCOUNTER — Encounter: Payer: Self-pay | Admitting: Gastroenterology

## 2024-02-18 ENCOUNTER — Encounter

## 2024-02-20 ENCOUNTER — Ambulatory Visit: Admitting: *Deleted

## 2024-02-20 ENCOUNTER — Encounter: Payer: Self-pay | Admitting: Gastroenterology

## 2024-02-20 VITALS — Ht 66.0 in | Wt 170.0 lb

## 2024-02-20 DIAGNOSIS — Z1211 Encounter for screening for malignant neoplasm of colon: Secondary | ICD-10-CM

## 2024-02-20 MED ORDER — NA SULFATE-K SULFATE-MG SULF 17.5-3.13-1.6 GM/177ML PO SOLN
1.0000 | Freq: Once | ORAL | 0 refills | Status: AC
Start: 2024-02-20 — End: 2024-02-20

## 2024-02-20 NOTE — Progress Notes (Signed)
 Pt's name and DOB verified at the beginning of the pre-visit wit 2 identifiers  Pt denies any difficulty with ambulating,sitting, laying down or rolling side to side  Pt has no issues moving head neck or swallowing  No egg or soy allergy known to patient   No issues known to pt with past sedation with any surgeries or procedures  No FH of Malignant Hyperthermia  Pt is not on home 02   Pt is not on blood thinners   Pt denies issues with constipation   Pt is not on dialysis  Pt denise any abnormal heart rhythms   Pt denies any upcoming cardiac testing  Patient's chart reviewed by Norleen Schillings CNRA prior to pre-visit and patient appropriate for the [facility].  Pre-visit completed and red dot placed by patient's name on their procedure day (on provider's schedule).    Visit by phone  Pt states weight is 170 lb  IInstructions reviewed. Pt given  both [facility] main # and MD on call # prior to instructions.  Pt states understanding of instructions. Instructed pt to review instructions again prior to procedure and call main # given if has questions.. Pt states they will.   Instructed pt on where to find instructions on My Chart.

## 2024-03-05 ENCOUNTER — Encounter: Admitting: Gastroenterology

## 2024-03-25 ENCOUNTER — Encounter: Payer: Self-pay | Admitting: Gastroenterology

## 2024-03-25 ENCOUNTER — Ambulatory Visit: Admitting: Gastroenterology

## 2024-03-25 VITALS — BP 151/100 | HR 53 | Temp 98.4°F | Resp 10 | Ht 66.0 in | Wt 170.0 lb

## 2024-03-25 DIAGNOSIS — K640 First degree hemorrhoids: Secondary | ICD-10-CM

## 2024-03-25 DIAGNOSIS — Z83719 Family history of colon polyps, unspecified: Secondary | ICD-10-CM | POA: Diagnosis not present

## 2024-03-25 DIAGNOSIS — K5730 Diverticulosis of large intestine without perforation or abscess without bleeding: Secondary | ICD-10-CM

## 2024-03-25 DIAGNOSIS — D125 Benign neoplasm of sigmoid colon: Secondary | ICD-10-CM | POA: Diagnosis not present

## 2024-03-25 DIAGNOSIS — Z1211 Encounter for screening for malignant neoplasm of colon: Secondary | ICD-10-CM

## 2024-03-25 MED ORDER — SODIUM CHLORIDE 0.9 % IV SOLN: 500.0000 mL | Freq: Once | INTRAVENOUS | Status: DC

## 2024-03-25 NOTE — Progress Notes (Signed)
 Called to room to assist during endoscopic procedure.  Patient ID and intended procedure confirmed with present staff. Received instructions for my participation in the procedure from the performing physician.

## 2024-03-25 NOTE — Progress Notes (Signed)
 Report given to PACU, vss

## 2024-03-25 NOTE — Op Note (Signed)
 [facility] Patient Name: Jessica Dickson Procedure Date: 03/25/2024 2:20 PM MRN: [id number] Endoscopist: Lynnie Bring , MD, [id number] Age: 53 Referring MD:  Date of Birth: April 18, 1971 Gender: Female Account #: [id number] Procedure:                Colonoscopy Indications:              Screening in patient at increased risk: Colorectal                            cancer in father before age 74 Medicines:                Monitored Anesthesia Care Procedure:                Pre-Anesthesia Assessment:                           - Prior to the procedure, a History and Physical                            was performed, and patient medications and                            allergies were reviewed. The patient's tolerance of                            previous anesthesia was also reviewed. The risks                            and benefits of the procedure and the sedation                            options and risks were discussed with the patient.                            All questions were answered, and informed consent                            was obtained. Prior Anticoagulants: The patient has                            taken no anticoagulant or antiplatelet agents. ASA                            Grade Assessment: II - A patient with mild systemic                            disease. After reviewing the risks and benefits,                            the patient was deemed in satisfactory condition to                            undergo the procedure.  After obtaining informed consent, the colonoscope                            was passed under direct vision. Throughout the                            procedure, the patient's blood pressure, pulse, and                            oxygen saturations were monitored continuously. The                            Olympus Scope [id number] was introduced through the                            anus and advanced to the  2 cm into the ileum. The                            colonoscopy was performed without difficulty. The                            patient tolerated the procedure well. The quality                            of the bowel preparation was good. The terminal                            ileum, ileocecal valve, appendiceal orifice, and                            rectum were photographed. Scope In: 2:25:31 PM Scope Out: 2:42:35 PM Scope Withdrawal Time: 0 hours 12 minutes 9 seconds  Total Procedure Duration: 0 hours 17 minutes 4 seconds  Findings:                 A 2 mm polyp was found in the distal sigmoid colon.                            The polyp was sessile. The polyp was removed with a                            cold snare. Resection complete. Not retrieved as it                            broke down into multiple small pieces.                           Many medium-mouthed diverticula were found in the                            sigmoid colon, few in descending colon and rare in  ascending colon. Few diverticula with stool                            impacted. No endoscopic evidence of diverticulitis.                           Non-bleeding internal hemorrhoids were found during                            retroflexion. The hemorrhoids were small and Grade                            I (internal hemorrhoids that do not prolapse).                           Retroflexion in the right colon was performed.                           No additional abnormalities were found on                            retroflexion. Complications:            No immediate complications. Estimated Blood Loss:     Estimated blood loss: none. Impression:               - One 2 mm polyp in the distal sigmoid colon,                            removed with a cold snare. Resected.                           - Mild pancolonic diverticulosis predominantly in                            the sigmoid colon.                            - Non-bleeding internal hemorrhoids. Recommendation:           - Patient has a contact number available for                            emergencies. The signs and symptoms of potential                            delayed complications were discussed with the                            patient. Return to normal activities tomorrow.                            Written discharge instructions were provided to the                            patient.                           -  High fiber diet.. Increase water intake.                           - Continue present medications.                           - Repeat colonoscopy in 5 years for screening                            purposes.                           - The findings and recommendations were discussed                            with the patient's family. Lynnie Bring, MD 03/25/2024 2:49:01 PM This report has been signed electronically.

## 2024-03-25 NOTE — Patient Instructions (Signed)
 High fiber diet. Increase water intake. Continue present medications.  Repeat colonoscopy in 5 years for screening purposes.  Handouts provided on polyps, diverticulosis, and hemorrhoids.   YOU HAD AN ENDOSCOPIC PROCEDURE TODAY AT THE [facility]:   Refer to the procedure report that was given to you for any specific questions about what was found during the examination.  If the procedure report does not answer your questions, please call your gastroenterologist to clarify.  If you requested that your care partner not be given the details of your procedure findings, then the procedure report has been included in a sealed envelope for you to review at your convenience later.  YOU SHOULD EXPECT: Some feelings of bloating in the abdomen. Passage of more gas than usual.  Walking can help get rid of the air that was put into your GI tract during the procedure and reduce the bloating. If you had a lower endoscopy (such as a colonoscopy or flexible sigmoidoscopy) you may notice spotting of blood in your stool or on the toilet paper. If you underwent a bowel prep for your procedure, you may not have a normal bowel movement for a few days.  Please Note:  You might notice some irritation and congestion in your nose or some drainage.  This is from the oxygen used during your procedure.  There is no need for concern and it should clear up in a day or so.  SYMPTOMS TO REPORT IMMEDIATELY:  Following lower endoscopy (colonoscopy or flexible sigmoidoscopy):  Excessive amounts of blood in the stool  Significant tenderness or worsening of abdominal pains  Swelling of the abdomen that is new, acute  Fever of 100F or higher  For urgent or emergent issues, a gastroenterologist can be reached at any hour by calling [phone]. Do not use MyChart messaging for urgent concerns.    DIET:  We do recommend a small meal at first, but then you may proceed to your regular diet.  Drink plenty of fluids but  you should avoid alcoholic beverages for 24 hours.  ACTIVITY:  You should plan to take it easy for the rest of today and you should NOT DRIVE or use heavy machinery until tomorrow (because of the sedation medicines used during the test).    FOLLOW UP: Our staff will call the number listed on your records the next business day following your procedure.  We will call around 7:15- 8:00 am to check on you and address any questions or concerns that you may have regarding the information given to you following your procedure. If we do not reach you, we will leave a message.     If any biopsies were taken you will be contacted by phone or by letter within the next 1-3 weeks.  Please call us  at [phone] if you have not heard about the biopsies in 3 weeks.    SIGNATURES/CONFIDENTIALITY: You and/or your care partner have signed paperwork which will be entered into your electronic medical record.  These signatures attest to the fact that that the information above on your After Visit Summary has been reviewed and is understood.  Full responsibility of the confidentiality of this discharge information lies with you and/or your care-partner.

## 2024-03-25 NOTE — Progress Notes (Signed)
 [facility] History and Physical   Primary Care Physician:  Jama Chow, MD   Reason for Procedure:   FH Polyps 0 dad<60, neg colon 10 yrs ago by Dr CHRISTELLA  Plan:    colon     HPI: Jessica Dickson is a 53 y.o. female    Past Medical History:  Diagnosis Date   Hashimoto's disease    Hemorrhoids    Hypothyroidism    Migraines     Past Surgical History:  Procedure Laterality Date   ABDOMINAL HYSTERECTOMY     ABLATION N/A    APPENDECTOMY     BREAST BIOPSY Right 01/20/2023   MM RT BREAST BX W LOC DEV 1ST LESION IMAGE BX SPEC STEREO GUIDE 01/20/2023 [facility]   BREAST BIOPSY Right 01/20/2023   MM RT BREAST BX W LOC DEV EA AD LESION IMG BX SPEC STEREO GUIDE 01/20/2023 [facility]   BREAST BIOPSY  02/26/2023   MM RT RADIOACTIVE SEED LOC MAMMO GUIDE 02/26/2023 [facility]   BREAST LUMPECTOMY WITH RADIOACTIVE SEED LOCALIZATION Right 02/27/2023   Procedure: RIGHT BREAST LUMPECTOMY WITH RADIOACTIVE SEED LOCALIZATION;  Surgeon: Vernetta Berg, MD;  Location: [facility];  Service: General;  Laterality: Right;   NOSE SURGERY      Prior to Admission medications   Medication Sig Start Date End Date Taking? Authorizing Provider  calcium carbonate (OSCAL) 1500 (600 Ca) MG TABS tablet    Yes [provider]  DOTTI 0.05 MG/24HR patch    Yes [provider]  melatonin 3 MG TABS tablet Take 3 mg by mouth at bedtime.   Yes [provider]  Multiple Vitamin (MULTIVITAMIN) tablet Take 1 tablet by mouth daily.   Yes [provider]  omega-3 acid ethyl esters (LOVAZA) 1 g capsule Take 1 g by mouth 2 (two) times daily.   Yes [provider]  progesterone (PROMETRIUM) 200 MG capsule Take 200 mg by mouth daily.   Yes [provider]  thyroid (NP THYROID) 60 MG tablet Take 60 mg by mouth daily. 03/28/19  Yes [provider]  cetirizine (ZYRTEC) 10 MG chewable tablet Chew 10 mg by mouth  daily. Patient taking differently: Chew 10 mg by mouth as needed.    [provider]  cholecalciferol (VITAMIN D3) 25 MCG (1000 UNIT) tablet Take 1,000 Units by mouth daily.    [provider]  Iodine, Kelp, 0.15 MG TABS Take 1 tablet by mouth once a week.    [provider]    Current Outpatient Medications  Medication Sig Dispense Refill   calcium carbonate (OSCAL) 1500 (600 Ca) MG TABS tablet      DOTTI 0.05 MG/24HR patch      melatonin 3 MG TABS tablet Take 3 mg by mouth at bedtime.     Multiple Vitamin (MULTIVITAMIN) tablet Take 1 tablet by mouth daily.     omega-3 acid ethyl esters (LOVAZA) 1 g capsule Take 1 g by mouth 2 (two) times daily.     progesterone (PROMETRIUM) 200 MG capsule Take 200 mg by mouth daily.     thyroid (NP THYROID) 60 MG tablet Take 60 mg by mouth daily.     cetirizine (ZYRTEC) 10 MG chewable tablet Chew 10 mg by mouth daily. (Patient taking differently: Chew 10 mg by mouth as needed.)     cholecalciferol (VITAMIN D3) 25 MCG (1000 UNIT) tablet Take 1,000 Units by mouth daily.     Iodine, Kelp, 0.15 MG TABS Take 1  tablet by mouth once a week.     Current Facility-Administered Medications  Medication Dose Route Frequency Provider Last Rate Last Admin   0.9 %  sodium chloride  infusion  500 mL Intravenous Once Charlanne Groom, MD        Allergies as of 03/25/2024 - Review Complete 03/25/2024  Allergen Reaction Noted   Codeine Nausea Only 02/19/2023   Conjugated estrogens Rash and Other (See Comments) 02/20/2024    Family History  Problem Relation Age of Onset   Colon polyps Father    Breast cancer Paternal Aunt        x 3   Colon cancer Neg Hx    Esophageal cancer Neg Hx    Rectal cancer Neg Hx    Stomach cancer Neg Hx     Social History   Socioeconomic History   Marital status: Married    Spouse name: Not on file   Number of children: Not on file   Years of education: Not on file   Highest education level: Not on file   Occupational History   Not on file  Tobacco Use   Smoking status: Never   Smokeless tobacco: Never  Vaping Use   Vaping status: Never Used  Substance and Sexual Activity   Alcohol use: Not Currently   Drug use: Never   Sexual activity: Not on file  Other Topics Concern   Not on file  Social History Narrative   Not on file   Social Drivers of Health   Financial Resource Strain: Not on file  Food Insecurity: Not on file  Transportation Needs: Not on file  Physical Activity: Not on file  Stress: Not on file  Social Connections: Not on file  Intimate Partner Violence: Not on file    Review of Systems: Positive for none All other review of systems negative except as mentioned in the HPI.  Physical Exam: Vital signs in last 24 hours: @VSRANGES @   General:   Alert,  Well-developed, well-nourished, pleasant and cooperative in NAD Lungs:  Clear throughout to auscultation.   Heart:  Regular rate and rhythm; no murmurs, clicks, rubs,  or gallops. Abdomen:  Soft, nontender and nondistended. Normal bowel sounds.   Neuro/Psych:  Alert and cooperative. Normal mood and affect. A and O x 3    No significant changes were identified.  The patient continues to be an appropriate candidate for the planned procedure and anesthesia.   Anselm Charlanne, MD. [facility] 03/25/2024 2:18 PM@

## 2024-03-25 NOTE — Progress Notes (Signed)
 Pt's states no medical or surgical changes since previsit or office visit.

## 2024-03-26 ENCOUNTER — Telehealth: Payer: Self-pay

## 2024-03-26 NOTE — Telephone Encounter (Signed)
 Left message on answering machine.
